# Patient Record
Sex: Male | Born: 1975 | Hispanic: Yes | Marital: Married | Smoking: Never smoker
Health system: Southern US, Community
[De-identification: ages and names within clinical notes are randomized; demographics above are authoritative.]

---

## 2014-12-11 ENCOUNTER — Emergency Department (HOSPITAL_COMMUNITY)
Admission: EM | Admit: 2014-12-11 | Discharge: 2014-12-12 | Disposition: A | Discharge status: Home / Self Care | Payer: Self-pay | Attending: Emergency Medicine | Admitting: Emergency Medicine

## 2014-12-11 ENCOUNTER — Encounter (HOSPITAL_COMMUNITY): Payer: Self-pay | Admitting: Emergency Medicine

## 2014-12-11 ENCOUNTER — Emergency Department (HOSPITAL_COMMUNITY): Payer: Self-pay

## 2014-12-11 DIAGNOSIS — M10042 Idiopathic gout, left hand: Secondary | ICD-10-CM | POA: Insufficient documentation

## 2014-12-11 LAB — CBC WITH DIFFERENTIAL/PLATELET
BASOS ABS: 0 10*3/uL (ref 0.0–0.1)
Basophils Relative: 0 % (ref 0–1)
Eosinophils Absolute: 0.4 10*3/uL (ref 0.0–0.7)
Eosinophils Relative: 3 % (ref 0–5)
HCT: 40.9 % (ref 39.0–52.0)
HEMOGLOBIN: 14 g/dL (ref 13.0–17.0)
LYMPHS PCT: 14 % (ref 12–46)
Lymphs Abs: 1.8 10*3/uL (ref 0.7–4.0)
MCH: 29.8 pg (ref 26.0–34.0)
MCHC: 34.2 g/dL (ref 30.0–36.0)
MCV: 87 fL (ref 78.0–100.0)
Monocytes Absolute: 0.7 10*3/uL (ref 0.1–1.0)
Monocytes Relative: 6 % (ref 3–12)
NEUTROS ABS: 9.3 10*3/uL — AB (ref 1.7–7.7)
Neutrophils Relative %: 77 % (ref 43–77)
PLATELETS: 201 10*3/uL (ref 150–400)
RBC: 4.7 MIL/uL (ref 4.22–5.81)
RDW: 13.7 % (ref 11.5–15.5)
WBC: 12.2 10*3/uL — AB (ref 4.0–10.5)

## 2014-12-11 MED ORDER — PREDNISONE 20 MG PO TABS
60.0000 mg | ORAL_TABLET | Freq: Once | ORAL | Status: AC
  Administered 2014-12-12: 60 mg via ORAL
  Filled 2014-12-11: qty 3

## 2014-12-11 MED ORDER — OXYCODONE-ACETAMINOPHEN 5-325 MG PO TABS
2.0000 | ORAL_TABLET | Freq: Once | ORAL | Status: AC
  Administered 2014-12-11: 2 via ORAL
  Filled 2014-12-11: qty 2

## 2014-12-11 MED ORDER — INDOMETHACIN 25 MG PO CAPS
50.0000 mg | ORAL_CAPSULE | Freq: Once | ORAL | Status: AC
  Administered 2014-12-12: 50 mg via ORAL
  Filled 2014-12-11: qty 2

## 2014-12-11 NOTE — ED Notes (Signed)
Pt. injured his left wrist while lifting a heavy frame at work last Monday , presents with left wrist pain and swelling .

## 2014-12-11 NOTE — ED Notes (Signed)
Patient transported to X-ray 

## 2014-12-11 NOTE — ED Notes (Signed)
Robyn, [place], at the bedside.

## 2014-12-11 NOTE — ED Provider Notes (Signed)
CSN: [id number]     Arrival date & time 12/11/14  2114 History  This chart was scribed for Peter Severin, MD by Annye Asa, ED Scribe. This patient was seen in room B15C/B15C and the patient's care was started at 11:18 PM.    Chief Complaint  Patient presents with  . Hand Pain   The history is provided by the patient. No language interpreter was used.     HPI Comments: Peter Pruitt is a right-hand dominant 39 y.o. male with past medical history of gout who presents to the Emergency Department complaining of left hand, wrist, and forearm pain beginning yesterday and worsening acutely today around 10:00. He notes associated swelling to the area. Patient explains that he was lifting a heavy frame at work three days PTA, but denies any specific injury to the affected hand; he states, "I felt only a little bit of pain then, but today is really when it starting hurting." He has been driving from [place] over the past two days and notes that his pain has impacted his driving; "I had to rest my hand against the window or in my lap." He denies pain in the left elbow or left shoulder. He denies a history of diabetes, kidney problems. He denies recent alcohol consumption.   He experiences gout flares approximately once per year, generally in the knees and ankles - he has previously experienced mild pain in the left wrist. He occasionally takes medication (Celebrex and another, patient unable to specify) for gout but purposely avoids medications during a flare, stating "It makes it worse." Patient has previously broken the left forearm.   PCP Dr. Mayford Knife   History reviewed. No pertinent past medical history. History reviewed. No pertinent past surgical history. No family history on file. History  Substance Use Topics  . Smoking status: Never Smoker   . Smokeless tobacco: Not on file  . Alcohol Use: No    Review of Systems  Musculoskeletal: Positive for arthralgias (Left wrist).  All other systems  reviewed and are negative.   Allergies  Review of patient's allergies indicates no known allergies.  Home Medications   Prior to Admission medications   Not on File   BP 156/87 mmHg  Pulse 70  Temp(Src) 99 F (37.2 C)  Resp 18  Wt 235 lb (106.595 kg)  SpO2 99% Physical Exam  Constitutional: He is oriented to person, place, and time. He appears well-developed and well-nourished.  HENT:  Head: Normocephalic and atraumatic.  Neck: No tracheal deviation present.  Cardiovascular: Normal rate.   Pulmonary/Chest: Effort normal.  Musculoskeletal: Normal range of motion. He exhibits no edema.  Left upper extremity: left wrist swollen and warm without erythema. Normal sensation, normal pulses.   Neurological: He is alert and oriented to person, place, and time.  Skin: Skin is warm and dry.  Psychiatric: He has a normal mood and affect. His behavior is normal.  Nursing note and vitals reviewed.   ED Course  Procedures   DIAGNOSTIC STUDIES: Oxygen Saturation is 99% on RA, normal by my interpretation.    COORDINATION OF CARE: 11:25 PM Discussed treatment plan with pt at bedside and pt agreed to plan.   Labs Review Labs Reviewed  BASIC METABOLIC PANEL  CBC WITH DIFFERENTIAL/PLATELET  URIC ACID    Imaging Review Dg Wrist Complete Left  12/11/2014   CLINICAL DATA:  hurt left wrist while lifting heavy frame at work on 12/09/14, pain left wrist  EXAM: LEFT WRIST - COMPLETE 3+  VIEW  COMPARISON:  None.  FINDINGS: Corticated ossicle distal to the ulnar styloid suggesting remote injury. No acute fracture. Carpal rows intact. No other significant osseous degenerative change. Normal mineralization and alignment. Regional soft tissues unremarkable.  IMPRESSION: 1. 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  No acute bone abnormality.   Electronically Signed   By: Corlis Leak  Hassell M.D.   On: 12/11/2014 21:49     EKG Interpretation None      MDM   Final diagnoses:  Acute idiopathic gout of left  hand    I personally performed the services described in this documentation, which was scribed in my presence. The recorded information has been reviewed and is accurate.  39 yo male with acute hand/wrist pain.  History of gout.  No signs of cellulitis.  Plan for f/u with local pcp, treatment with prednisone, pain control, splinting for comfort.    Peter Severinlga Peter Criger, MD 12/17/14 [phone]

## 2014-12-11 NOTE — ED Provider Notes (Signed)
39 year old male complaining of left wrist and hand pain 3 days. States he was lifting heavy frame at work, however cannot recall any specific injury. He did feel a slight pull. Since then, he has been experiencing extreme pain, worse with any slight movement to his left hand and wrist. States he cannot move his middle or ring finger. Reports a history of gout about 5 years ago. No recent alcohol intake. Reports eating tuna fish, however denies any other fish or red meat. On exam, his hand is swollen throughout up into his wrist. An old scar is noted that patient reports is from 20 years ago. Hand is warm without any erythema. Does not have an appearance of gout. Extreme tenderness with any movement of his wrist. Given the pain out of proportion to exam and history, patient will be moved to a higher acuity. X-ray is negative.  Kathrynn SpeedRobyn M Teonia Yager, PA-C 12/11/14 2203  Mancel BaleElliott Wentz, MD 12/12/14 [phone]

## 2014-12-12 LAB — BASIC METABOLIC PANEL
Anion gap: 11 (ref 5–15)
BUN: 9 mg/dL (ref 6–20)
CO2: 23 mmol/L (ref 22–32)
Calcium: 8.9 mg/dL (ref 8.9–10.3)
Chloride: 102 mmol/L (ref 101–111)
Creatinine, Ser: 0.98 mg/dL (ref 0.61–1.24)
GFR calc Af Amer: >60 mL/min (ref >60)
GFR calc non Af Amer: >60 mL/min (ref >60)
Glucose, Bld: 137 mg/dL — ABNORMAL HIGH (ref 65–99)
POTASSIUM: 4.4 mmol/L (ref 3.5–5.1)
Sodium: 136 mmol/L (ref 135–145)

## 2014-12-12 LAB — URIC ACID: URIC ACID, SERUM: 7.8 mg/dL — AB (ref 4.4–7.6)

## 2014-12-12 MED ORDER — OXYCODONE-ACETAMINOPHEN 5-325 MG PO TABS
2.0000 | ORAL_TABLET | Freq: Once | ORAL | Status: AC
Start: 2014-12-12 — End: 2014-12-12
  Administered 2014-12-12: 2 via ORAL
  Filled 2014-12-12: qty 2

## 2014-12-12 MED ORDER — INDOMETHACIN 25 MG PO CAPS: 25.0000 mg | ORAL_CAPSULE | Freq: Three times a day (TID) | ORAL | Status: AC | PRN

## 2014-12-12 MED ORDER — OXYCODONE-ACETAMINOPHEN 5-325 MG PO TABS: 2.0000 | ORAL_TABLET | ORAL | Status: AC | PRN

## 2014-12-12 MED ORDER — PREDNISONE 20 MG PO TABS: 60.0000 mg | ORAL_TABLET | Freq: Every day | ORAL | Status: AC

## 2014-12-12 NOTE — Discharge Instructions (Signed)
Gout Gout is an inflammatory arthritis caused by a buildup of uric acid crystals in the joints. Uric acid is a chemical that is normally present in the blood. When the level of uric acid in the blood is too high it can form crystals that deposit in your joints and tissues. This causes joint redness, soreness, and swelling (inflammation). Repeat attacks are common. Over time, uric acid crystals can form into masses (tophi) near a joint, destroying bone and causing disfigurement. Gout is treatable and often preventable. CAUSES  The disease begins with elevated levels of uric acid in the blood. Uric acid is produced by your body when it breaks down a naturally found substance called purines. Certain foods you eat, such as meats and fish, contain high amounts of purines. Causes of an elevated uric acid level include:  Being passed down from parent to child (heredity).  Diseases that cause increased uric acid production (such as obesity, psoriasis, and certain cancers).  Excessive alcohol use.  Diet, especially diets rich in meat and seafood.  Medicines, including certain cancer-fighting medicines (chemotherapy), water pills (diuretics), and aspirin.  Chronic kidney disease. The kidneys are no longer able to remove uric acid well.  Problems with metabolism. Conditions strongly associated with gout include:  Obesity.  High blood pressure.  High cholesterol.  Diabetes. Not everyone with elevated uric acid levels gets gout. It is not understood why some people get gout and others do not. Surgery, joint injury, and eating too much of certain foods are some of the factors that can lead to gout attacks. SYMPTOMS   An attack of gout comes on quickly. It causes intense pain with redness, swelling, and warmth in a joint.  Fever can occur.  Often, only one joint is involved. Certain joints are more commonly involved:  Base of the big toe.  Knee.  Ankle.  Wrist.  Finger. Without  treatment, an attack usually goes away in a few days to weeks. Between attacks, you usually will not have symptoms, which is different from many other forms of arthritis. DIAGNOSIS  Your caregiver will suspect gout based on your symptoms and exam. In some cases, tests may be recommended. The tests may include:  Blood tests.  Urine tests.  X-rays.  Joint fluid exam. This exam requires a needle to remove fluid from the joint (arthrocentesis). Using a microscope, gout is confirmed when uric acid crystals are seen in the joint fluid. TREATMENT  There are two phases to gout treatment: treating the sudden onset (acute) attack and preventing attacks (prophylaxis).  Treatment of an Acute Attack.  Medicines are used. These include anti-inflammatory medicines or steroid medicines.  An injection of steroid medicine into the affected joint is sometimes necessary.  The painful joint is rested. Movement can worsen the arthritis.  You may use warm or cold treatments on painful joints, depending which works best for you.  Treatment to Prevent Attacks.  If you suffer from frequent gout attacks, your caregiver may advise preventive medicine. These medicines are started after the acute attack subsides. These medicines either help your kidneys eliminate uric acid from your body or decrease your uric acid production. You may need to stay on these medicines for a very long time.  The early phase of treatment with preventive medicine can be associated with an increase in acute gout attacks. For this reason, during the first few months of treatment, your caregiver may also advise you to take medicines usually used for acute gout treatment. Be sure you   understand your caregiver's directions. Your caregiver may make several adjustments to your medicine dose before these medicines are effective.  Discuss dietary treatment with your caregiver or dietitian. Alcohol and drinks high in sugar and fructose and foods  such as meat, poultry, and seafood can increase uric acid levels. Your caregiver or dietitian can advise you on drinks and foods that should be limited. HOME CARE INSTRUCTIONS   Do not take aspirin to relieve pain. This raises uric acid levels.  Only take over-the-counter or prescription medicines for pain, discomfort, or fever as directed by your caregiver.  Rest the joint as much as possible. When in bed, keep sheets and blankets off painful areas.  Keep the affected joint raised (elevated).  Apply warm or cold treatments to painful joints. Use of warm or cold treatments depends on which works best for you.  Use crutches if the painful joint is in your leg.  Drink enough fluids to keep your urine clear or pale yellow. This helps your body get rid of uric acid. Limit alcohol, sugary drinks, and fructose drinks.  Follow your dietary instructions. Pay careful attention to the amount of protein you eat. Your daily diet should emphasize fruits, vegetables, whole grains, and fat-free or low-fat milk products. Discuss the use of coffee, vitamin C, and cherries with your caregiver or dietitian. These may be helpful in lowering uric acid levels.  Maintain a healthy body weight. SEEK MEDICAL CARE IF:   You develop diarrhea, vomiting, or any side effects from medicines.  You do not feel better in 24 hours, or you are getting worse. SEEK IMMEDIATE MEDICAL CARE IF:   Your joint becomes suddenly more tender, and you have chills or a fever. MAKE SURE YOU:   Understand these instructions.  Will watch your condition.  Will get help right away if you are not doing well or get worse. Document Released: 06/25/2000 Document Revised: 11/12/2013 Document Reviewed: 02/09/2012 Exit[employer] Patient Information 2015 [employer]. This information is not intended to replace advice given to you by your health care provider. Make sure you discuss any questions you have with your health care  provider. Low-Purine Diet Purines are compounds that affect the level of uric acid in your body. A low-purine diet is a diet that is low in purines. Eating a low-purine diet can prevent the level of uric acid in your body from getting too high and causing gout or kidney stones or both. WHAT DO I NEED TO KNOW ABOUT THIS DIET?  Choose low-purine foods. Examples of low-purine foods are listed in the next section.  Drink plenty of fluids, especially water. Fluids can help remove uric acid from your body. Try to drink 8-16 cups (1.9-3.8 L) a day.  Limit foods high in fat, especially saturated fat, as fat makes it harder for the body to get rid of uric acid. Foods high in saturated fat include pizza, cheese, ice cream, whole milk, fried foods, and gravies. Choose foods that are lower in fat and lean sources of protein. Use olive oil when cooking as it contains healthy fats that are not high in saturated fat.  Limit alcohol. Alcohol interferes with the elimination of uric acid from your body. If you are having a gout attack, avoid all alcohol.  Keep in mind that different people's bodies react differently to different foods. You will probably learn over time which foods do or do not affect you. If you discover that a food tends to cause your gout to flare up,   avoid eating that food. You can more freely enjoy foods that do not cause problems. If you have any questions about a food item, talk to your dietitian or health care provider. WHICH FOODS ARE LOW, MODERATE, AND HIGH IN PURINES? The following is a list of foods that are low, moderate, and high in purines. You can eat any amount of the foods that are low in purines. You may be able to have small amounts of foods that are moderate in purines. Ask your health care provider how much of a food moderate in purines you can have. Avoid foods high in purines. Grains  Foods low in purines: Enriched white bread, pasta, rice, cake, cornbread, popcorn.  Foods  moderate in purines: Whole-grain breads and cereals, wheat germ, bran, oatmeal. Uncooked oatmeal. Dry wheat bran or wheat germ.  Foods high in purines: Pancakes, French toast, biscuits, muffins. Vegetables  Foods low in purines: All vegetables, except those that are moderate in purines.  Foods moderate in purines: Asparagus, cauliflower, spinach, mushrooms, green peas. Fruits  All fruits are low in purines. Meats and other Protein Foods  Foods low in purines: Eggs, nuts, peanut butter.  Foods moderate in purines: 80-90% lean beef, lamb, veal, pork, poultry, fish, eggs, peanut butter, nuts. Crab, lobster, oysters, and shrimp. Cooked dried beans, peas, and lentils.  Foods high in purines: Anchovies, sardines, herring, mussels, tuna, codfish, scallops, trout, and haddock. Bacon. Organ meats (such as liver or kidney). Tripe. Game meat. Goose. Sweetbreads. Dairy  All dairy foods are low in purines. Low-fat and fat-free dairy products are best because they are low in saturated fat. Beverages  Drinks low in purines: Water, carbonated beverages, tea, coffee, cocoa.  Drinks moderate in purines: Soft drinks and other drinks sweetened with high-fructose corn syrup. Juices. To find whether a food or drink is sweetened with high-fructose corn syrup, look at the ingredients list.  Drinks high in purines: Alcoholic beverages (such as beer). Condiments  Foods low in purines: Salt, herbs, olives, pickles, relishes, vinegar.  Foods moderate in purines: Butter, margarine, oils, mayonnaise. Fats and Oils  Foods low in purines: All types, except gravies and sauces made with meat.  Foods high in purines: Gravies and sauces made with meat. Other Foods  Foods low in purines: Sugars, sweets, gelatin. Cake. Soups made without meat.  Foods moderate in purines: Meat-based or fish-based soups, broths, or bouillons. Foods and drinks sweetened with high-fructose corn syrup.  Foods high in purines:  High-fat desserts (such as ice cream, cookies, cakes, pies, doughnuts, and chocolate). Contact your dietitian for more information on foods that are not listed here. Document Released: 10/23/2010 Document Revised: 07/03/2013 Document Reviewed: 06/04/2013 [employer] Patient Information 2015 [employer]. This information is not intended to replace advice given to you by your health care provider. Make sure you discuss any questions you have with your health care provider.  

## 2014-12-12 NOTE — Progress Notes (Signed)
Orthopedic Tech Progress Note Patient Details:  Axel FillerJuan G Kuntzman 07-07-1976 [id number]  Ortho Devices Type of Ortho Device: Velcro wrist forearm splint Ortho Device/Splint Location: LUE Ortho Device/Splint Interventions: Application   Asia R Thompson 12/12/2014, 12:32 AM

## 2016-11-24 IMAGING — DX DG WRIST COMPLETE 3+V*L*
4 series · 4 of 4 positions shown · non-contrast
Comparison: None.

CLINICAL DATA: hurt left wrist while lifting heavy frame at work on
12/09/14, pain left wrist

EXAM:
LEFT WRIST - COMPLETE 3+ VIEW

[wrist pa]
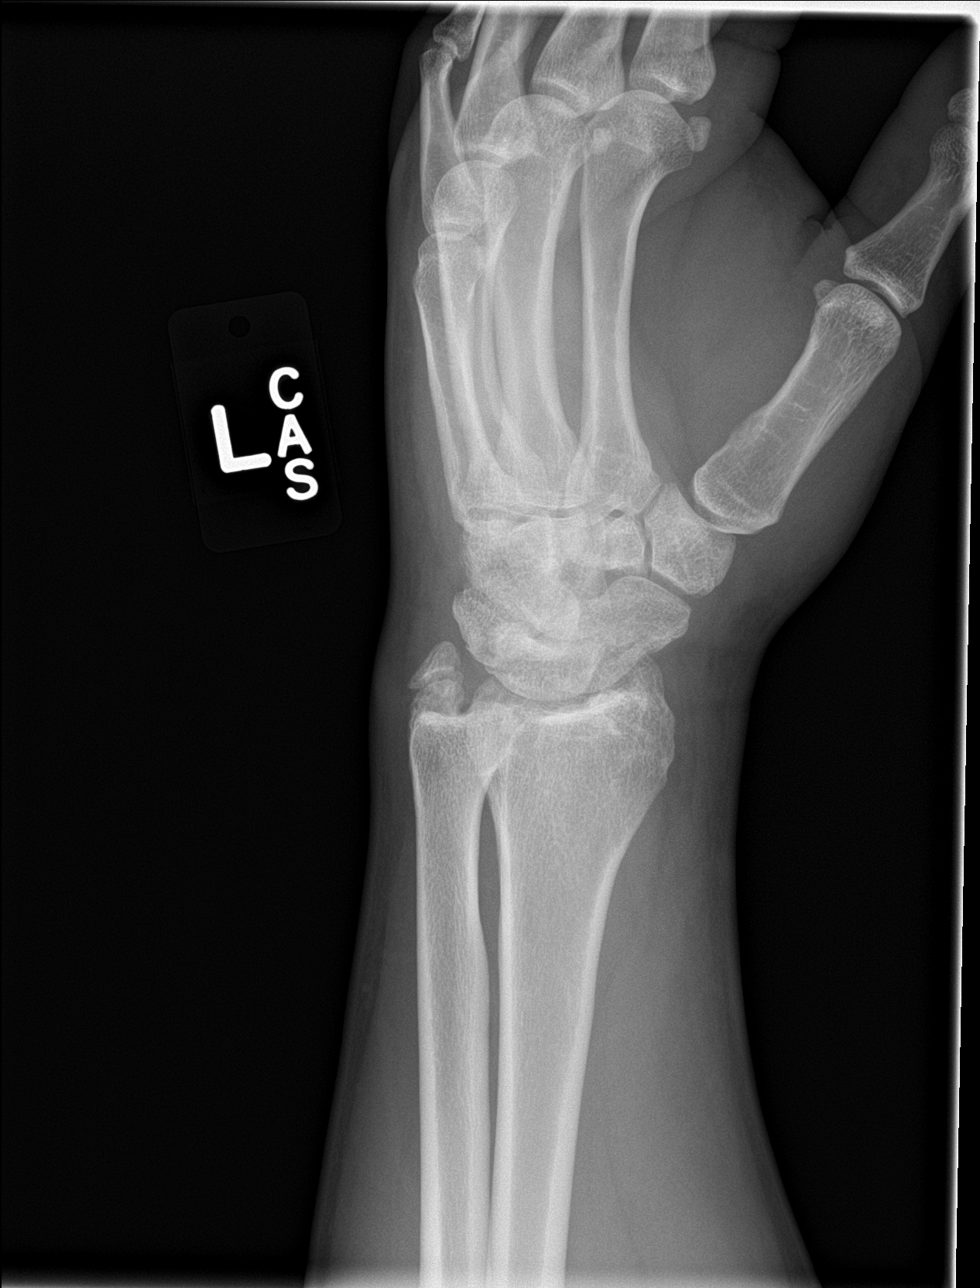

[wrist obl]
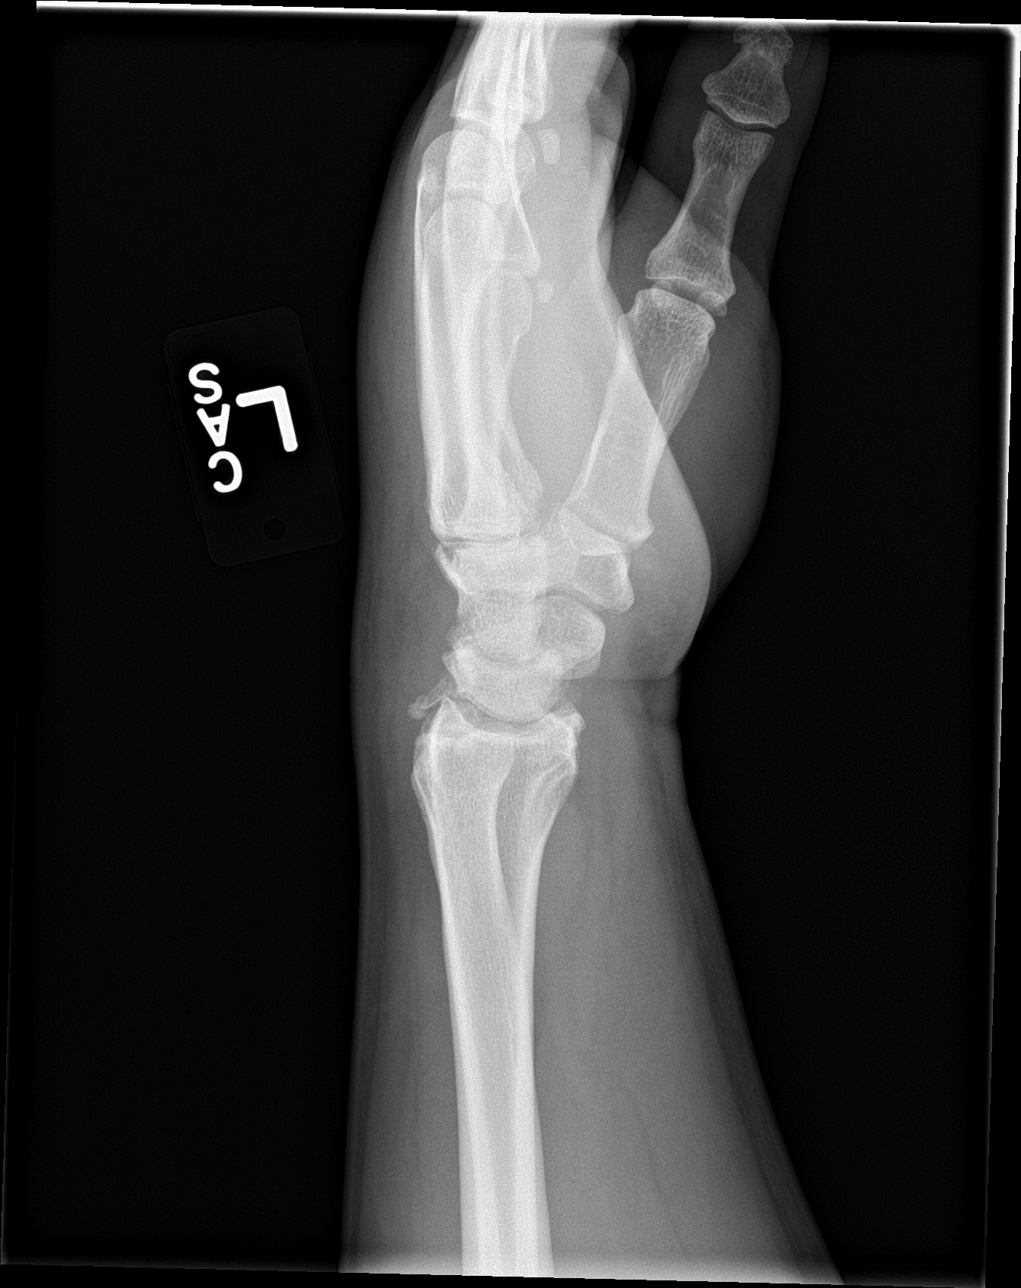

[wrist lat]
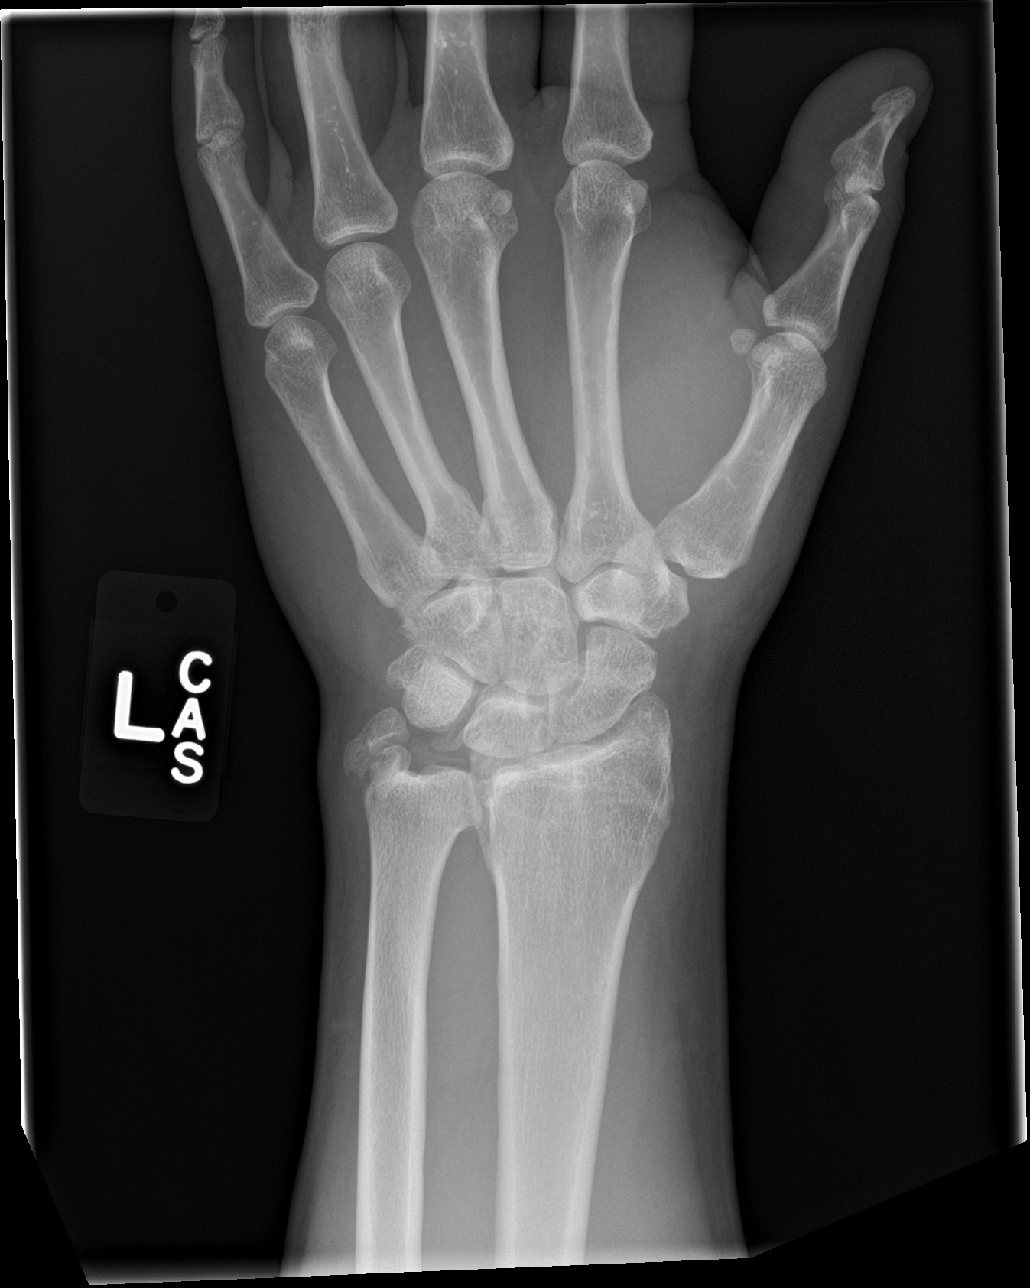

[wrist navicular]
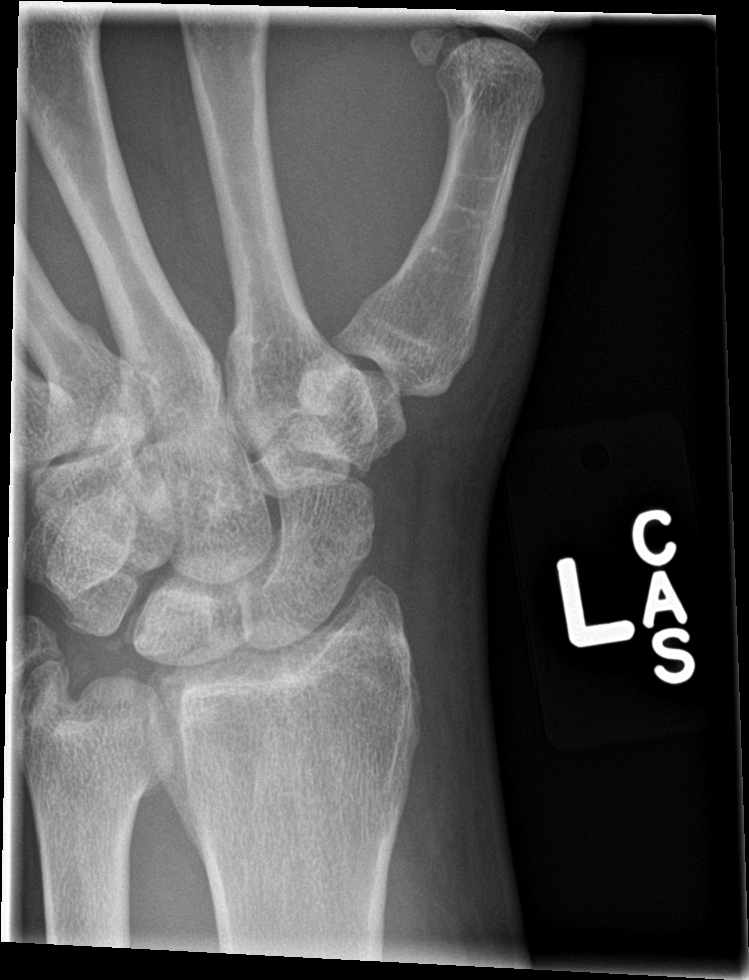

[4 of 4 positions shown; findings below may reference images not displayed]

FINDINGS: Corticated ossicle distal to the ulnar styloid suggesting remote
injury. No acute fracture. Carpal rows intact. No other significant
osseous degenerative change. Normal mineralization and alignment.
Regional soft tissues unremarkable.
IMPRESSION: 1. Old ulnar styloid avulsion injury.  No acute bone abnormality.
